# Patient Record
Sex: Male | Born: 1965 | Race: White | Hispanic: No | Marital: Single | Smoking: Never smoker
Health system: Southern US, Community
[De-identification: ages and names within clinical notes are randomized; demographics above are authoritative.]

## PROBLEM LIST (undated history)

## (undated) DIAGNOSIS — N200 Calculus of kidney: Secondary | ICD-10-CM

## (undated) DIAGNOSIS — Z789 Other specified health status: Secondary | ICD-10-CM

## (undated) HISTORY — DX: Calculus of kidney: N20.0

---
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
## 2010-01-24 ENCOUNTER — Emergency Department (HOSPITAL_COMMUNITY): Admission: EM | Admit: 2010-01-24 | Discharge: 2010-01-24 | Payer: Self-pay | Admitting: Emergency Medicine

## 2010-09-30 LAB — COMPREHENSIVE METABOLIC PANEL
ALT: 31 U/L (ref 0–53)
AST: 29 U/L (ref 0–37)
Albumin: 4.1 g/dL (ref 3.5–5.2)
Alkaline Phosphatase: 83 U/L (ref 39–117)
BUN: 29 mg/dL — ABNORMAL HIGH (ref 6–23)
CO2: 26 mEq/L (ref 19–32)
Calcium: 9.4 mg/dL (ref 8.4–10.5)
Chloride: 104 mEq/L (ref 96–112)
GFR calc Af Amer: >60 mL/min (ref >60)
Glucose, Bld: 147 mg/dL — ABNORMAL HIGH (ref 70–99)
Sodium: 137 mEq/L (ref 135–145)
Total Bilirubin: 1 mg/dL (ref 0.3–1.2)

## 2010-09-30 LAB — LIPASE, BLOOD: Lipase: 33 U/L (ref 11–59)

## 2010-09-30 LAB — URINALYSIS, ROUTINE W REFLEX MICROSCOPIC
Bilirubin Urine: NEGATIVE
Glucose, UA: NEGATIVE mg/dL
Ketones, ur: NEGATIVE mg/dL
Nitrite: NEGATIVE
Specific Gravity, Urine: 1.02 (ref 1.005–1.030)

## 2010-09-30 LAB — CBC
MCH: 28.2 pg (ref 26.0–34.0)
MCHC: 34.2 g/dL (ref 30.0–36.0)
Platelets: 137 10*3/uL — ABNORMAL LOW (ref 150–400)
RDW: 14.6 % (ref 11.5–15.5)

## 2010-09-30 LAB — DIFFERENTIAL
Basophils Absolute: 0 10*3/uL (ref 0.0–0.1)
Basophils Relative: 0 % (ref 0–1)
Eosinophils Relative: 0 % (ref 0–5)
Neutrophils Relative %: 84 % — ABNORMAL HIGH (ref 43–77)

## 2010-12-14 ENCOUNTER — Emergency Department (HOSPITAL_COMMUNITY): Payer: Commercial Indemnity

## 2010-12-14 ENCOUNTER — Emergency Department (HOSPITAL_COMMUNITY)
Admission: EM | Admit: 2010-12-14 | Discharge: 2010-12-14 | Disposition: A | Discharge status: Home / Self Care | Payer: Commercial Indemnity | Attending: Emergency Medicine | Admitting: Emergency Medicine

## 2010-12-14 DIAGNOSIS — R55 Syncope and collapse: Secondary | ICD-10-CM | POA: Insufficient documentation

## 2010-12-14 DIAGNOSIS — K922 Gastrointestinal hemorrhage, unspecified: Secondary | ICD-10-CM | POA: Insufficient documentation

## 2010-12-14 LAB — COMPREHENSIVE METABOLIC PANEL
ALT: 23 U/L (ref 0–53)
Albumin: 3.8 g/dL (ref 3.5–5.2)
Calcium: 8.8 mg/dL (ref 8.4–10.5)
GFR calc Af Amer: >60 mL/min (ref >60)
Glucose, Bld: 109 mg/dL — ABNORMAL HIGH (ref 70–99)
Potassium: 3.9 mEq/L (ref 3.5–5.1)

## 2010-12-14 LAB — TROPONIN I: Troponin I: <0.3 ng/mL (ref <0.30)

## 2010-12-14 LAB — DIFFERENTIAL
Basophils Absolute: 0 10*3/uL (ref 0.0–0.1)
Basophils Relative: 0 % (ref 0–1)
Eosinophils Absolute: 0.1 10*3/uL (ref 0.0–0.7)
Eosinophils Relative: 2 % (ref 0–5)
Lymphs Abs: 1 10*3/uL (ref 0.7–4.0)
Monocytes Absolute: 0.3 10*3/uL (ref 0.1–1.0)
Neutrophils Relative %: 64 % (ref 43–77)

## 2010-12-14 LAB — CBC
HCT: 41.9 % (ref 39.0–52.0)
MCH: 27.2 pg (ref 26.0–34.0)
MCHC: 33.2 g/dL (ref 30.0–36.0)
MCV: 82 fL (ref 78.0–100.0)
RBC: 5.11 MIL/uL (ref 4.22–5.81)
RDW: 14 % (ref 11.5–15.5)

## 2010-12-14 LAB — TYPE AND SCREEN
ABO/RH(D): B POS
Antibody Screen: NEGATIVE

## 2010-12-14 LAB — PROTIME-INR: INR: 0.97 (ref 0.00–1.49)

## 2010-12-14 LAB — CK TOTAL AND CKMB (NOT AT ARMC)
CK, MB: 4.2 ng/mL — ABNORMAL HIGH (ref 0.3–4.0)
Relative Index: 1.3 (ref 0.0–2.5)
Total CK: 315 U/L — ABNORMAL HIGH (ref 7–232)

## 2012-02-26 ENCOUNTER — Ambulatory Visit (INDEPENDENT_AMBULATORY_CARE_PROVIDER_SITE_OTHER): Payer: Commercial Indemnity | Admitting: Family Medicine

## 2012-02-26 VITALS — BP 118/84 | HR 75 | Temp 98.7°F | Resp 16 | Ht 68.0 in | Wt 167.6 lb

## 2012-02-26 DIAGNOSIS — Z0000 Encounter for general adult medical examination without abnormal findings: Secondary | ICD-10-CM

## 2012-02-26 LAB — COMPREHENSIVE METABOLIC PANEL
ALT: 28 U/L (ref 0–53)
AST: 24 U/L (ref 0–37)
Albumin: 4.6 g/dL (ref 3.5–5.2)
Alkaline Phosphatase: 73 U/L (ref 39–117)
BUN: 18 mg/dL (ref 6–23)
CO2: 29 mEq/L (ref 19–32)
Calcium: 9.3 mg/dL (ref 8.4–10.5)
Chloride: 105 mEq/L (ref 96–112)
Creat: 1.05 mg/dL (ref 0.50–1.35)
Glucose, Bld: 96 mg/dL (ref 70–99)
Potassium: 4.6 mEq/L (ref 3.5–5.3)
Sodium: 140 mEq/L (ref 135–145)
Total Bilirubin: 1.4 mg/dL — ABNORMAL HIGH (ref 0.3–1.2)
Total Protein: 7.6 g/dL (ref 6.0–8.3)

## 2012-02-26 LAB — LIPID PANEL
Cholesterol: 159 mg/dL (ref 0–200)
HDL: 38 mg/dL — ABNORMAL LOW (ref >39)
LDL Cholesterol: 79 mg/dL (ref 0–99)
Total CHOL/HDL Ratio: 4.2 Ratio
Triglycerides: 209 mg/dL — ABNORMAL HIGH (ref <150)
VLDL: 42 mg/dL — ABNORMAL HIGH (ref 0–40)

## 2012-02-26 LAB — POCT CBC
Granulocyte percent: 60.5 %G (ref 37–80)
HCT, POC: 49.9 % (ref 43.5–53.7)
Hemoglobin: 16 g/dL (ref 14.1–18.1)
Lymph, poc: 1.6 (ref 0.6–3.4)
MCH, POC: 27.4 pg (ref 27–31.2)
MCHC: 32.1 g/dL (ref 31.8–35.4)
MCV: 85.5 fL (ref 80–97)
MID (cbc): 0.3 (ref 0–0.9)
MPV: 9 fL (ref 0–99.8)
POC Granulocyte: 3 (ref 2–6.9)
POC LYMPH PERCENT: 33 %L (ref 10–50)
POC MID %: 6.5 %M (ref 0–12)
Platelet Count, POC: 180 10*3/uL (ref 142–424)
RBC: 5.84 M/uL (ref 4.69–6.13)
RDW, POC: 15.6 %
WBC: 4.9 10*3/uL (ref 4.6–10.2)

## 2012-02-26 MED ORDER — TETANUS-DIPHTH-ACELL PERTUSSIS 5-2.5-18.5 LF-MCG/0.5 IM SUSP
0.5000 mL | Freq: Once | INTRAMUSCULAR | Status: AC
  Administered 2012-02-26: 0.5 mL via INTRAMUSCULAR

## 2012-02-26 NOTE — Patient Instructions (Addendum)
@[email or web address] @  Patient ID: Frank Calhoun MRN: [id number], DOB: [birth year] 46 y.o. Date of Encounter: 02/26/2012, 12:57 PM  Primary Physician: No primary provider on file.  Chief Complaint: Physical (CPE)  HPI: 46 y.o. y/o male with history noted below here for CPE.  Doing well. No issues/complaints.  He works at [employer] for 25 years, lives with parents Review of Systems: Consitutional: No fever, chills, fatigue, night sweats, lymphadenopathy, or weight changes. Eyes: No visual changes, eye redness, or discharge. ENT/Mouth: Ears: No otalgia, tinnitus, hearing loss, discharge. Nose: No congestion, rhinorrhea, sinus pain, or epistaxis. Throat: No sore throat, post nasal drip, or teeth pain. Cardiovascular: No CP, palpitations, diaphoresis, DOE, edema, orthopnea, PND. Respiratory: No cough, hemoptysis, SOB, or wheezing. Gastrointestinal: No anorexia, dysphagia, reflux, pain, nausea, vomiting, hematemesis, diarrhea, constipation, BRBPR, or melena. Genitourinary: No dysuria, frequency, urgency, hematuria, incontinence, nocturia, decreased urinary stream, discharge, impotence, or testicular pain/masses. Musculoskeletal: No decreased ROM, myalgias, stiffness, joint swelling, or weakness. Skin: No rash, erythema, lesion changes, pain, warmth, jaundice, or pruritis. Neurological: No headache, dizziness, syncope, seizures, tremors, memory loss, coordination problems, or paresthesias. Psychological: No anxiety, depression, hallucinations, SI/HI. Endocrine: No fatigue, polydipsia, polyphagia, polyuria, or known diabetes. All other systems were reviewed and are otherwise negative.  Past Medical History  Diagnosis Date  . Kidney stone      History reviewed. No pertinent past surgical history.  Home Meds:  Prior to Admission medications   Not on File    Allergies: No Known Allergies  History   Social History  . Marital Status: Single    Spouse Name: N/A    Number of Children: N/A    . Years of Education: N/A   Occupational History  . Not on file.   Social History Main Topics  . Smoking status: Never Smoker   . Smokeless tobacco: Not on file  . Alcohol Use: No  . Drug Use: No  . Sexually Active: Not on file   Other Topics Concern  . Not on file   Social History Narrative  . No narrative on file    Family History  Problem Relation Age of Onset  . Cancer Father   . Stroke Maternal Grandmother     Physical Exam: Blood pressure 118/84, pulse 75, temperature 98.7 F (37.1 C), temperature source Oral, resp. rate 16, height 5\' 8"  (1.727 m), weight 167 lb 9.6 oz (76.023 kg), SpO2 98.00%.  General: Well developed, well nourished, in no acute distress. HEENT: Normocephalic, atraumatic. Conjunctiva pink, sclera non-icteric. Pupils 2 mm constricting to 1 mm, round, regular, and equally reactive to light and accomodation. EOMI. Internal auditory canal clear. TMs with good cone of light and without pathology. Nasal mucosa pink. Nares are without discharge. No sinus tenderness. Oral mucosa pink. Dentition good. Pharynx without exudate.   Neck: Supple. Trachea midline. No thyromegaly. Full ROM. No lymphadenopathy. Lungs: Clear to auscultation bilaterally without wheezes, rales, or rhonchi. Breathing is of normal effort and unlabored. Cardiovascular: RRR with S1 S2. No murmurs, rubs, or gallops appreciated. Distal pulses 2+ symmetrically. No carotid or abdominal bruits Abdomen: Soft, non-tender, non-distended with normoactive bowel sounds. No hepatosplenomegaly or masses. No rebound/guarding. No CVA tenderness. Without hernias.  Genitourinary: refused Musculoskeletal: Full range of motion and 5/5 strength throughout. Without swelling, atrophy, tenderness, crepitus, or warmth. Extremities without clubbing, cyanosis, or edema. Calves supple. Skin: Warm and moist without erythema, ecchymosis, wounds, or rash. Neuro: A+Ox3. CN II-XII grossly intact. Moves all extremities  spontaneously. Full sensation throughout. Normal gait. DTR 2+  throughout upper and lower extremities. Finger to nose intact. Psych:  Responds to questions appropriately with a normal affect.   Results for orders placed in visit on 02/26/12  POCT CBC      Component Value Range   WBC 4.9  4.6 - 10.2 K/uL   Lymph, poc 1.6  0.6 - 3.4   POC LYMPH PERCENT 33.0  10 - 50 %L   MID (cbc) 0.3  0 - 0.9   POC MID % 6.5  0 - 12 %M   POC Granulocyte 3.0  2 - 6.9   Granulocyte percent 60.5  37 - 80 %G   RBC 5.84  4.69 - 6.13 M/uL   Hemoglobin 16.0  14.1 - 18.1 g/dL   HCT, POC 16.1  09.6 - 53.7 %   MCV 85.5  80 - 97 fL   MCH, POC 27.4  27 - 31.2 pg   MCHC 32.1  31.8 - 35.4 g/dL   RDW, POC 04.5     Platelet Count, POC 180  142 - 424 K/uL   MPV 9.0  0 - 99.8 fL     Assessment/Plan:  46 y.o. y/o friendly man here for CPE.  He is very functional although he is clearly very careful and a bit suspicious of the medical profession I will send labs to patient when they are complete 1. Healthcare maintenance  EKG 12-Lead, POCT CBC, Comprehensive metabolic panel, Lipid panel, EKG 12-Lead, TDaP (BOOSTRIX) injection 0.5 mL    -  Signed, Elvina Sidle, MD 02/26/2012 12:57 PM

## 2012-02-26 NOTE — Progress Notes (Signed)
  Subjective:    Patient ID: Frank Calhoun, male    DOB: February 12, 1966, 46 y.o.   MRN: [id number]  HPI  Works at [employer] for 25 years.  Lives with parents.    Review of Systems  Constitutional: Negative.   HENT: Negative.   Eyes: Negative.   Respiratory: Negative.   Cardiovascular: Negative.   Gastrointestinal: Negative.   Genitourinary: Negative.   Musculoskeletal: Negative.   Skin: Negative.   Neurological: Negative.   Hematological: Negative.   Psychiatric/Behavioral: Negative.        Objective:   Physical Exam HEENT:  Wears glasses, normal oropharynx, normal TM's and grossly normal hearing Neck:  Supple, normal palpation Chest:  Clear Heart: reg, no murmurs Abdomen: soft, no HSM, no masses Skin: clear Extremities:  FROM with no muscle wasting  Neuro:  Very suspicious and paranoid behavior     Assessment & Plan:  46 yo man who works at [employer] and lives with parents.  He is quite careful about his personal space. Nevertheless, he is stable and I have tried to meet his needs.  Plan:   1. Healthcare maintenance  EKG 12-Lead, POCT CBC, Comprehensive metabolic panel, Lipid panel, EKG 12-Lead, TDaP (BOOSTRIX) injection 0.5 mL

## 2012-03-07 ENCOUNTER — Telehealth: Payer: Self-pay

## 2012-03-07 NOTE — Telephone Encounter (Signed)
Pt is returning our call 

## 2021-03-29 ENCOUNTER — Encounter (HOSPITAL_BASED_OUTPATIENT_CLINIC_OR_DEPARTMENT_OTHER): Payer: Self-pay | Admitting: Urology

## 2021-03-29 ENCOUNTER — Other Ambulatory Visit: Payer: Self-pay | Admitting: Urology

## 2021-03-29 DIAGNOSIS — N201 Calculus of ureter: Secondary | ICD-10-CM

## 2021-03-29 NOTE — Progress Notes (Signed)
03/29/2021 3:10 PM Pre procedure call completed. Spoke with patient and his mother Lelan Pons. Pt. Updated on date 04/02/21 and time of arrival 0915. Times for NPO MN and clear liquid consumption until 0815 reviewed. Pt. Allergies, medical hx and medication list reviewed. Medications ok to take day of surgery and those to hold prior to surgery reviewed. Ride secured for day of surgery. Questions and concerns addressed by patient. Pt. Verbalized understanding of all instructions.  Sherell Christoffel, Arville Lime

## 2021-04-02 ENCOUNTER — Encounter (HOSPITAL_BASED_OUTPATIENT_CLINIC_OR_DEPARTMENT_OTHER)
Admission: RE | Disposition: A | Discharge status: Home / Self Care | Payer: Self-pay | Source: Home / Self Care | Attending: Urology

## 2021-04-02 ENCOUNTER — Encounter (HOSPITAL_BASED_OUTPATIENT_CLINIC_OR_DEPARTMENT_OTHER): Payer: Self-pay | Admitting: Urology

## 2021-04-02 ENCOUNTER — Ambulatory Visit (HOSPITAL_COMMUNITY): Payer: Managed Care, Other (non HMO)

## 2021-04-02 ENCOUNTER — Ambulatory Visit (HOSPITAL_BASED_OUTPATIENT_CLINIC_OR_DEPARTMENT_OTHER)
Admission: RE | Admit: 2021-04-02 | Discharge: 2021-04-02 | Disposition: A | Discharge status: Home / Self Care | Payer: Managed Care, Other (non HMO) | Attending: Urology | Admitting: Urology

## 2021-04-02 DIAGNOSIS — N201 Calculus of ureter: Secondary | ICD-10-CM | POA: Insufficient documentation

## 2021-04-02 HISTORY — PX: EXTRACORPOREAL SHOCK WAVE LITHOTRIPSY: SHX1557

## 2021-04-02 HISTORY — DX: Other specified health status: Z78.9

## 2021-04-02 SURGERY — LITHOTRIPSY, ESWL
Anesthesia: LOCAL | Laterality: Left

## 2021-04-02 MED ORDER — DIPHENHYDRAMINE HCL 25 MG PO CAPS: 25.0000 mg | ORAL_CAPSULE | ORAL | Status: DC

## 2021-04-02 MED ORDER — TAMSULOSIN HCL 0.4 MG PO CAPS: 0.4000 mg | ORAL_CAPSULE | Freq: Every day | ORAL | 0 refills | Status: AC

## 2021-04-02 MED ORDER — CIPROFLOXACIN HCL 500 MG PO TABS: 500.0000 mg | ORAL_TABLET | ORAL | Status: DC

## 2021-04-02 MED ORDER — CIPROFLOXACIN HCL 500 MG PO TABS
500.0000 mg | ORAL_TABLET | ORAL | Status: AC
  Administered 2021-04-02: 500 mg via ORAL

## 2021-04-02 MED ORDER — DIPHENHYDRAMINE HCL 25 MG PO CAPS
25.0000 mg | ORAL_CAPSULE | ORAL | Status: AC
  Administered 2021-04-02: 25 mg via ORAL

## 2021-04-02 MED ORDER — OXYCODONE-ACETAMINOPHEN 5-325 MG PO TABS: 1.0000 | ORAL_TABLET | Freq: Four times a day (QID) | ORAL | 0 refills | Status: AC | PRN

## 2021-04-02 MED ORDER — DIPHENHYDRAMINE HCL 25 MG PO CAPS
ORAL_CAPSULE | ORAL | Status: AC
  Filled 2021-04-02: qty 1

## 2021-04-02 MED ORDER — SENNOSIDES-DOCUSATE SODIUM 8.6-50 MG PO TABS: 1.0000 | ORAL_TABLET | Freq: Two times a day (BID) | ORAL | 0 refills | Status: AC

## 2021-04-02 MED ORDER — CIPROFLOXACIN HCL 500 MG PO TABS
ORAL_TABLET | ORAL | Status: AC
  Filled 2021-04-02: qty 1

## 2021-04-02 MED ORDER — DIAZEPAM 5 MG PO TABS: 10.0000 mg | ORAL_TABLET | ORAL | Status: DC

## 2021-04-02 MED ORDER — SODIUM CHLORIDE 0.9 % IV SOLN: INTRAVENOUS | Status: DC

## 2021-04-02 MED ORDER — DIAZEPAM 5 MG PO TABS
ORAL_TABLET | ORAL | Status: AC
  Filled 2021-04-02: qty 2

## 2021-04-02 MED ORDER — SODIUM CHLORIDE 0.9 % IV SOLN
INTRAVENOUS | Status: DC
Start: 2021-04-02 — End: 2021-04-02

## 2021-04-02 MED ORDER — DIAZEPAM 5 MG PO TABS
10.0000 mg | ORAL_TABLET | ORAL | Status: AC
  Administered 2021-04-02: 10 mg via ORAL

## 2021-04-02 NOTE — H&P (Signed)
Frank Calhoun is an 55 y.o. male.    Chief Complaint: Pre-OP LEFT Shockwave Lithotripsy  HPI:   1 - LEFT Mid Ureteral Stone - 68m left mid stone by ER CT 03/2021 at level of L4 transverse process[facility]  Today " Frank Calhoun is seen to proceed with LEFT shockwave lithotripsy. No interval fevers.   Past Medical History:  Diagnosis Date  . Kidney stone   . Medical history non-contributory     Past Surgical History:  Procedure Laterality Date  . TONSILLECTOMY  1970    Family History  Problem Relation Age of Onset  . Cancer Father   . Stroke Maternal Grandmother    Social History:  reports that he does not drink alcohol and does not use drugs. No history on file for tobacco use.  Allergies: No Known Allergies  No medications prior to admission.    No results found for this or any previous visit (from the past 48 hour(s)). No results found.  Review of Systems  Constitutional:  Negative for chills and fever.  Genitourinary:  Positive for flank pain.  All other systems reviewed and are negative.  There were no vitals taken for this visit. Physical Exam Vitals reviewed.  HENT:     Head: Normocephalic.  Eyes:     Pupils: Pupils are equal, round, and reactive to light.  Cardiovascular:     Rate and Rhythm: Normal rate.  Pulmonary:     Effort: Pulmonary effort is normal.  Abdominal:     General: Abdomen is flat.  Genitourinary:    Comments: Mild left CVAT at prsent.  Musculoskeletal:     Cervical back: Normal range of motion.  Skin:    General: Skin is warm.  Neurological:     General: No focal deficit present.     Mental Status: He is alert.  Psychiatric:        Mood and Affect: Mood normal.     Comments: Blunt affect     Assessment/Plan  Proceed as planned with LEFT shockwave lithotripsy. Risks, benefits, alternatives, expected peri-treatment course discussed previously and reiterated today.   Frank Frock  MD 04/02/2021, 5:43 AM

## 2021-04-02 NOTE — Discharge Instructions (Addendum)
1 - You may have urinary urgency (bladder spasms), pass small stone fragments,  and bloody urine on / off for up to 2 weeks.  This is normal.  2 - Call MD or go to ER for fever >102, severe pain / nausea / vomiting not relieved by medications, or acute change in medical status  Post Anesthesia Home Care Instructions  Activity: Get plenty of rest for the remainder of the day. A responsible adult should stay with you for 24 hours following the procedure.  For the next 24 hours, DO NOT: -Drive a car -Paediatric nurse -Drink alcoholic beverages -Take any medication unless instructed by your physician -Make any legal decisions or sign important papers.  Meals: Start with liquid foods such as gelatin or soup. Progress to regular foods as tolerated. Avoid greasy, spicy, heavy foods. If nausea and/or vomiting occur, drink only clear liquids until the nausea and/or vomiting subsides. Call your physician if vomiting continues.  Special Instructions/Symptoms: Your throat may feel dry or sore from the anesthesia or the breathing tube placed in your throat during surgery. If this causes discomfort, gargle with warm salt water. The discomfort should disappear within 24 hours.  If you had a scopolamine patch placed behind your ear for the management of post- operative nausea and/or vomiting:  1. The medication in the patch is effective for 72 hours, after which it should be removed.  Wrap patch in a tissue and discard in the trash. Wash hands thoroughly with soap and water. 2. You may remove the patch earlier than 72 hours if you experience unpleasant side effects which may include dry mouth, dizziness or visual disturbances. 3. Avoid touching the patch. Wash your hands with soap and water after contact with the patch.

## 2021-04-02 NOTE — Brief Op Note (Signed)
04/02/2021  11:49 AM  PATIENT:  Frank Calhoun  55 y.o. male  PRE-OPERATIVE DIAGNOSIS:  LEFT URETERAL STONE  POST-OPERATIVE DIAGNOSIS:  * No post-op diagnosis entered *  PROCEDURE:  Procedure(s): EXTRACORPOREAL SHOCK WAVE LITHOTRIPSY (ESWL) (Left)  SURGEON:  Surgeon(s) and Role:    * Alexis Frock, MD - Primary  PHYSICIAN ASSISTANT:   ASSISTANTS: none   ANESTHESIA:   MAC  EBL:  minimal   BLOOD ADMINISTERED:none  DRAINS: none   LOCAL MEDICATIONS USED:  NONE  SPECIMEN:  No Specimen  DISPOSITION OF SPECIMEN:  N/A  COUNTS:  YES  TOURNIQUET:  * No tourniquets in log *  DICTATION: .Note written in paper chart  PLAN OF CARE: Discharge to home after PACU  PATIENT DISPOSITION:  Short Stay   Delay start of Pharmacological VTE agent (>24hrs) due to surgical blood loss or risk of bleeding: not applicable

## 2021-04-03 ENCOUNTER — Encounter (HOSPITAL_BASED_OUTPATIENT_CLINIC_OR_DEPARTMENT_OTHER): Payer: Self-pay | Admitting: Urology

## 2022-04-16 ENCOUNTER — Other Ambulatory Visit: Payer: Self-pay | Admitting: Urology

## 2022-04-16 DIAGNOSIS — N281 Cyst of kidney, acquired: Secondary | ICD-10-CM

## 2022-05-16 ENCOUNTER — Ambulatory Visit
Admission: RE | Admit: 2022-05-16 | Discharge: 2022-05-16 | Disposition: A | Discharge status: Home / Self Care | Payer: Managed Care, Other (non HMO) | Source: Ambulatory Visit | Attending: Urology | Admitting: Urology

## 2022-05-16 DIAGNOSIS — N281 Cyst of kidney, acquired: Secondary | ICD-10-CM

## 2022-05-22 ENCOUNTER — Other Ambulatory Visit: Payer: Self-pay | Admitting: Adult Health

## 2022-05-22 DIAGNOSIS — D49511 Neoplasm of unspecified behavior of right kidney: Secondary | ICD-10-CM

## 2022-06-15 ENCOUNTER — Other Ambulatory Visit: Payer: Managed Care, Other (non HMO)

## 2022-06-19 ENCOUNTER — Other Ambulatory Visit: Payer: Self-pay | Admitting: Adult Health

## 2022-06-19 ENCOUNTER — Ambulatory Visit (HOSPITAL_COMMUNITY)
Admission: RE | Admit: 2022-06-19 | Discharge: 2022-06-19 | Disposition: A | Discharge status: Home / Self Care | Payer: Managed Care, Other (non HMO) | Source: Ambulatory Visit | Attending: Adult Health | Admitting: Adult Health

## 2022-06-19 ENCOUNTER — Other Ambulatory Visit (HOSPITAL_COMMUNITY): Payer: Self-pay | Admitting: Adult Health

## 2022-06-19 DIAGNOSIS — D49511 Neoplasm of unspecified behavior of right kidney: Secondary | ICD-10-CM

## 2022-06-19 MED ORDER — GADOBUTROL 1 MMOL/ML IV SOLN
7.5000 mL | Freq: Once | INTRAVENOUS | Status: AC | PRN
  Administered 2022-06-19: 7.5 mL via INTRAVENOUS

## 2023-01-23 IMAGING — DX DG ABDOMEN 1V
2 series · 2 of 2 positions shown · non-contrast
Comparison: Most recent available comparison 01/24/2010 abdominal
CT.

CLINICAL DATA: Left ureteral stone.

EXAM:
ABDOMEN - 1 VIEW

[abdomen kub (1 of 2)]
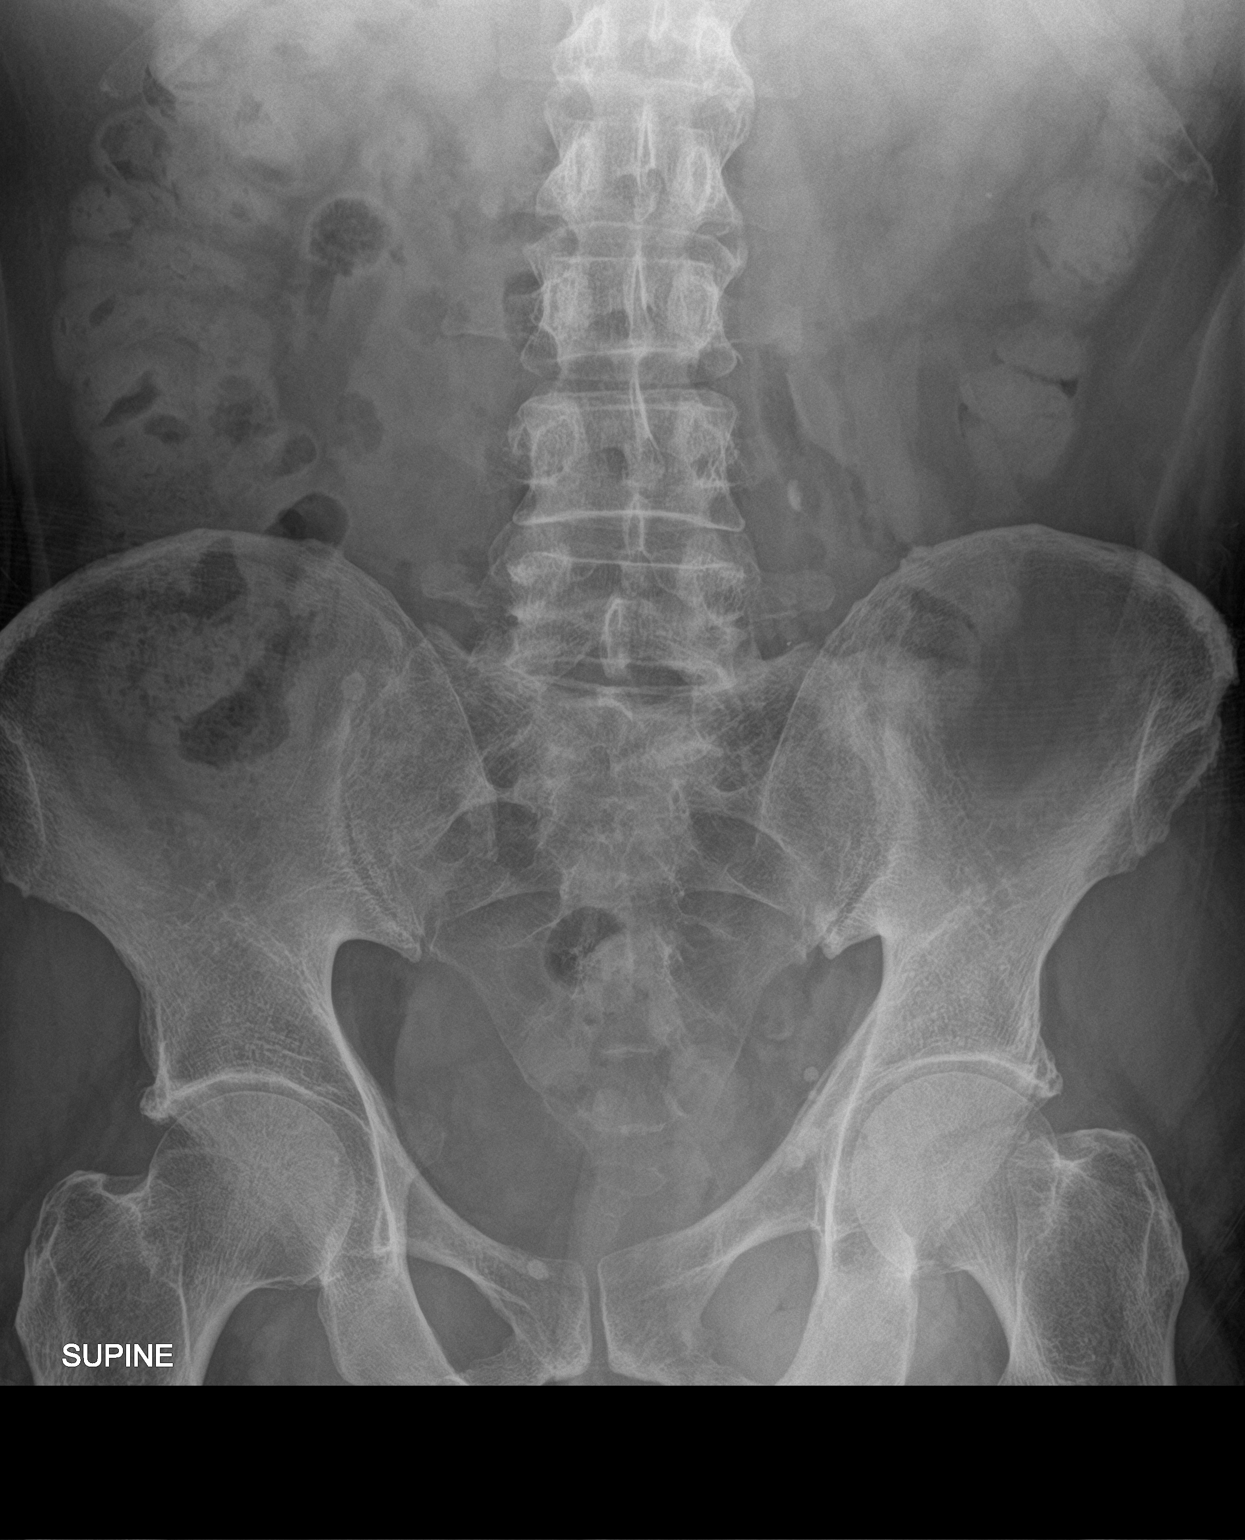

[abdomen kub (2 of 2)]
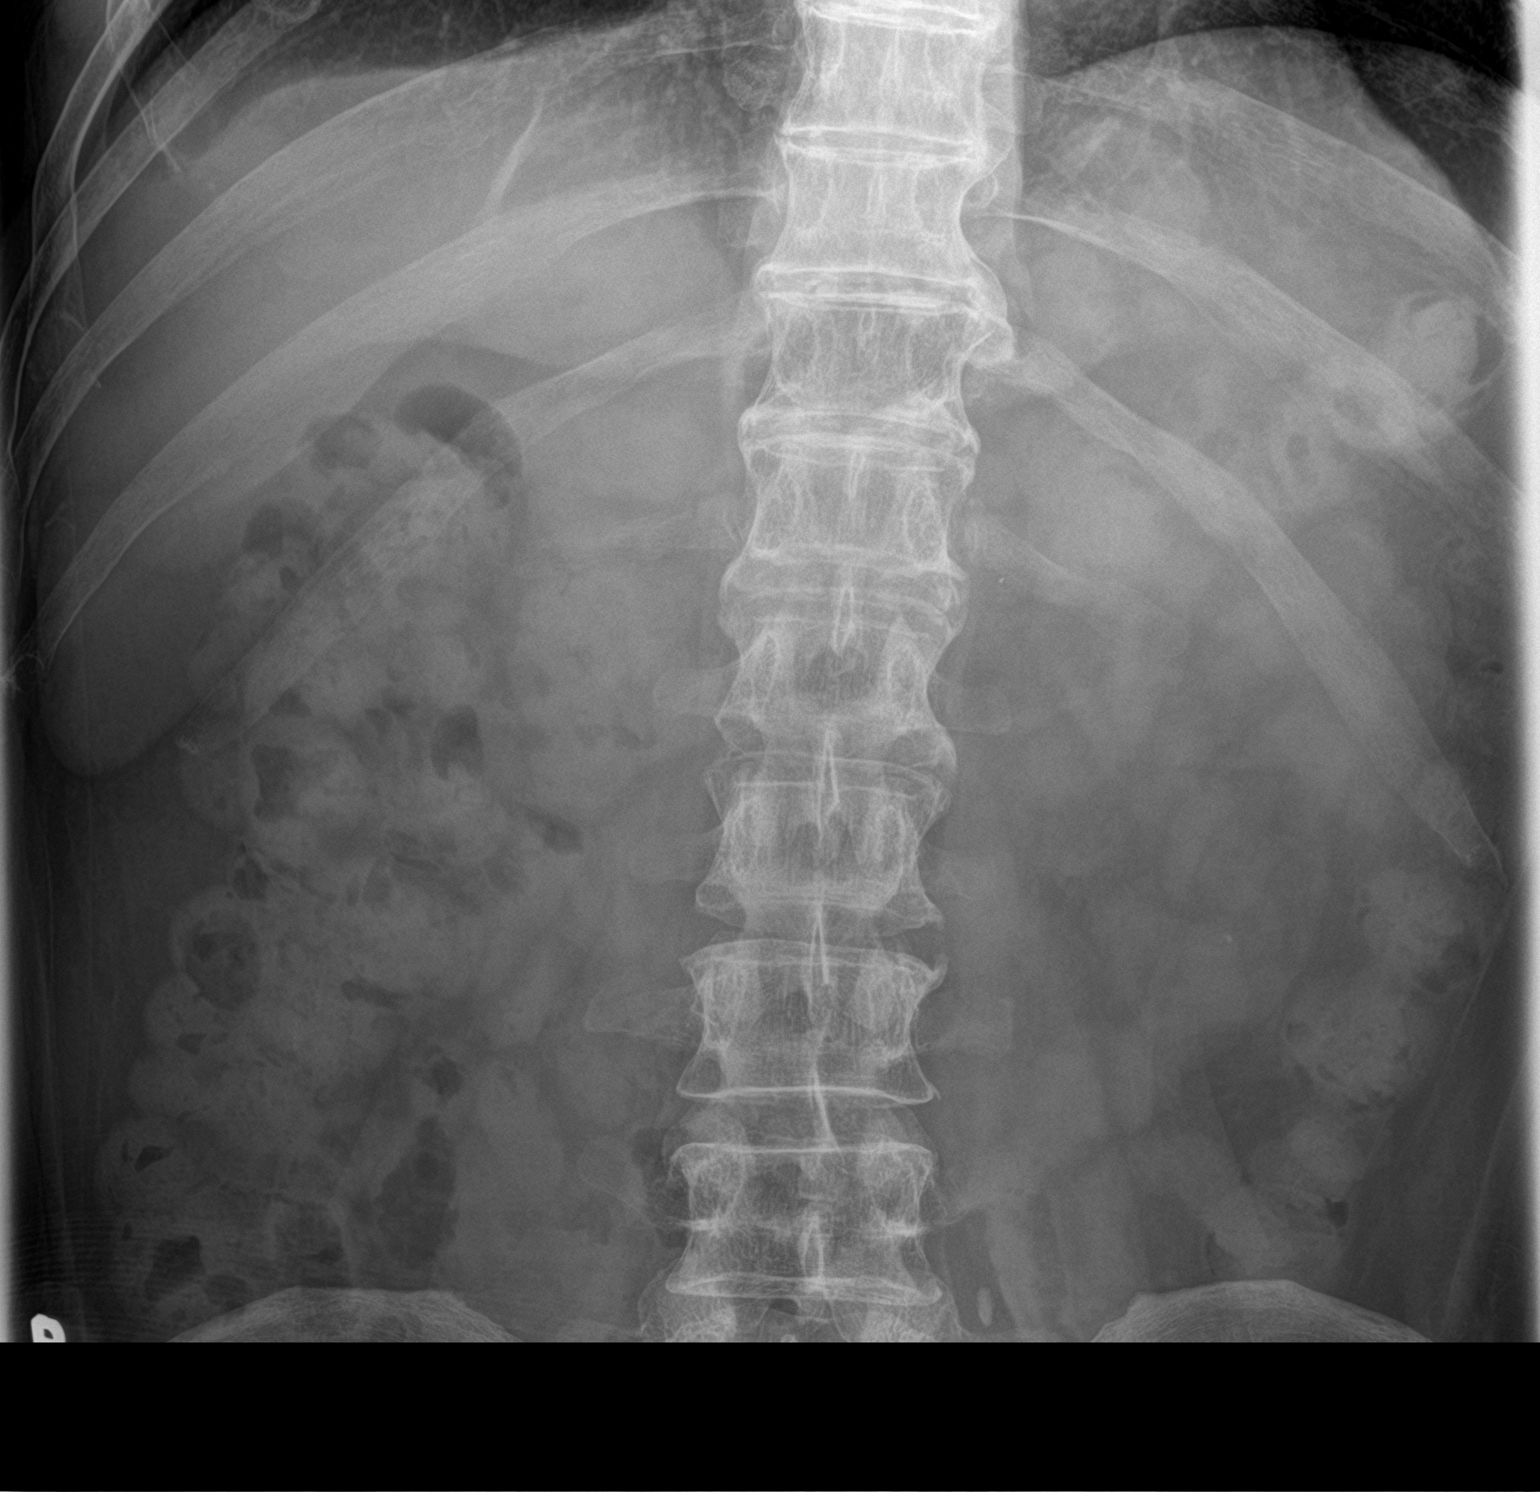

[2 of 2 positions shown; findings below may reference images not displayed]

FINDINGS: There is an 8 mm stone to the left of L4-L5 in the region of the mid
ureter. Punctate stone projects over the lower left kidney. No
visualized stones projecting over the right renal fossa or right
ureter, although stool partially obscures detailed assessment.
Multiple pelvic calcifications, more prominent on the left, similar
to prior exam and consistent with phleboliths. Normal bowel gas
pattern with moderate volume of colonic stool. No acute osseous
abnormalities are seen.
IMPRESSION: 1. An 8 mm calcification to the left of L4-L5 likely represents mid
ureteral stone.
2. Punctate stone projects over the lower left kidney.
